# Patient Record
Sex: Female | Born: 1975 | Race: White | Hispanic: No | Smoking: Current every day smoker
Health system: Southern US, Community
[De-identification: ages and names within clinical notes are randomized; demographics above are authoritative.]

## PROBLEM LIST (undated history)

## (undated) DIAGNOSIS — F32A Depression, unspecified: Secondary | ICD-10-CM

## (undated) DIAGNOSIS — I10 Essential (primary) hypertension: Secondary | ICD-10-CM

## (undated) DIAGNOSIS — J45909 Unspecified asthma, uncomplicated: Secondary | ICD-10-CM

## (undated) DIAGNOSIS — K219 Gastro-esophageal reflux disease without esophagitis: Secondary | ICD-10-CM

## (undated) DIAGNOSIS — M5430 Sciatica, unspecified side: Secondary | ICD-10-CM

## (undated) DIAGNOSIS — F329 Major depressive disorder, single episode, unspecified: Secondary | ICD-10-CM

---

## 2018-04-24 ENCOUNTER — Emergency Department: Payer: Self-pay

## 2018-04-24 ENCOUNTER — Encounter: Payer: Self-pay | Admitting: Emergency Medicine

## 2018-04-24 ENCOUNTER — Other Ambulatory Visit: Payer: Self-pay

## 2018-04-24 ENCOUNTER — Emergency Department
Admission: EM | Admit: 2018-04-24 | Discharge: 2018-04-24 | Disposition: A | Discharge status: Home / Self Care | Payer: Self-pay | Attending: Emergency Medicine | Admitting: Emergency Medicine

## 2018-04-24 DIAGNOSIS — E876 Hypokalemia: Secondary | ICD-10-CM | POA: Insufficient documentation

## 2018-04-24 DIAGNOSIS — K3184 Gastroparesis: Secondary | ICD-10-CM | POA: Insufficient documentation

## 2018-04-24 DIAGNOSIS — D473 Essential (hemorrhagic) thrombocythemia: Secondary | ICD-10-CM | POA: Insufficient documentation

## 2018-04-24 DIAGNOSIS — I10 Essential (primary) hypertension: Secondary | ICD-10-CM | POA: Insufficient documentation

## 2018-04-24 DIAGNOSIS — K2900 Acute gastritis without bleeding: Secondary | ICD-10-CM | POA: Insufficient documentation

## 2018-04-24 DIAGNOSIS — F17210 Nicotine dependence, cigarettes, uncomplicated: Secondary | ICD-10-CM | POA: Insufficient documentation

## 2018-04-24 DIAGNOSIS — R112 Nausea with vomiting, unspecified: Secondary | ICD-10-CM | POA: Insufficient documentation

## 2018-04-24 DIAGNOSIS — J45909 Unspecified asthma, uncomplicated: Secondary | ICD-10-CM | POA: Insufficient documentation

## 2018-04-24 DIAGNOSIS — D75839 Thrombocytosis, unspecified: Secondary | ICD-10-CM

## 2018-04-24 HISTORY — DX: Major depressive disorder, single episode, unspecified: F32.9

## 2018-04-24 HISTORY — DX: Unspecified asthma, uncomplicated: J45.909

## 2018-04-24 HISTORY — DX: Sciatica, unspecified side: M54.30

## 2018-04-24 HISTORY — DX: Depression, unspecified: F32.A

## 2018-04-24 HISTORY — DX: Essential (primary) hypertension: I10

## 2018-04-24 HISTORY — DX: Gastro-esophageal reflux disease without esophagitis: K21.9

## 2018-04-24 LAB — POCT PREGNANCY, URINE: Preg Test, Ur: NEGATIVE

## 2018-04-24 LAB — CBC WITH DIFFERENTIAL/PLATELET
BASOS ABS: 0.1 10*3/uL (ref 0–0.1)
BASOS PCT: 1 %
EOS PCT: 1 %
Eosinophils Absolute: 0.1 10*3/uL (ref 0–0.7)
HCT: 53.3 % — ABNORMAL HIGH (ref 35.0–47.0)
Hemoglobin: 18.5 g/dL — ABNORMAL HIGH (ref 12.0–16.0)
Lymphocytes Relative: 16 %
Lymphs Abs: 3.4 10*3/uL (ref 1.0–3.6)
MCH: 30.5 pg (ref 26.0–34.0)
MCHC: 34.8 g/dL (ref 32.0–36.0)
MCV: 87.6 fL (ref 80.0–100.0)
Monocytes Absolute: 1.3 10*3/uL — ABNORMAL HIGH (ref 0.2–0.9)
Monocytes Relative: 6 %
NEUTROS ABS: 15.8 10*3/uL — AB (ref 1.4–6.5)
Neutrophils Relative %: 76 %
Platelets: 669 10*3/uL — ABNORMAL HIGH (ref 150–440)
RBC: 6.09 MIL/uL — AB (ref 3.80–5.20)
RDW: 12.5 % (ref 11.5–14.5)
WBC: 20.7 10*3/uL — AB (ref 3.6–11.0)

## 2018-04-24 LAB — COMPREHENSIVE METABOLIC PANEL
ALT: 22 U/L (ref 0–44)
AST: 23 U/L (ref 15–41)
Albumin: 4.8 g/dL (ref 3.5–5.0)
Alkaline Phosphatase: 71 U/L (ref 38–126)
Anion gap: 22 — ABNORMAL HIGH (ref 5–15)
BUN: 25 mg/dL — ABNORMAL HIGH (ref 6–20)
CO2: 30 mmol/L (ref 22–32)
CREATININE: 0.95 mg/dL (ref 0.44–1.00)
Calcium: 9.9 mg/dL (ref 8.9–10.3)
Chloride: 81 mmol/L — ABNORMAL LOW (ref 98–111)
GFR calc non Af Amer: >60 mL/min (ref >60)
Glucose, Bld: 160 mg/dL — ABNORMAL HIGH (ref 70–99)
Potassium: 2.9 mmol/L — ABNORMAL LOW (ref 3.5–5.1)
Sodium: 133 mmol/L — ABNORMAL LOW (ref 135–145)
Total Bilirubin: 1.3 mg/dL — ABNORMAL HIGH (ref 0.3–1.2)
Total Protein: 8.6 g/dL — ABNORMAL HIGH (ref 6.5–8.1)

## 2018-04-24 LAB — URINALYSIS, COMPLETE (UACMP) WITH MICROSCOPIC
BILIRUBIN URINE: NEGATIVE
Bacteria, UA: NONE SEEN
Glucose, UA: NEGATIVE mg/dL
KETONES UR: 80 mg/dL — AB
Leukocytes, UA: NEGATIVE
Nitrite: NEGATIVE
PH: 7 (ref 5.0–8.0)
Protein, ur: 30 mg/dL — AB
SPECIFIC GRAVITY, URINE: 1.015 (ref 1.005–1.030)

## 2018-04-24 LAB — LIPASE, BLOOD: LIPASE: 31 U/L (ref 11–51)

## 2018-04-24 MED ORDER — FAMOTIDINE IN NACL 20-0.9 MG/50ML-% IV SOLN
20.0000 mg | Freq: Once | INTRAVENOUS | Status: AC
  Administered 2018-04-24: 20 mg via INTRAVENOUS
  Filled 2018-04-24: qty 50

## 2018-04-24 MED ORDER — ONDANSETRON HCL 4 MG/2ML IJ SOLN: 4.0000 mg | Freq: Once | INTRAMUSCULAR | Status: DC

## 2018-04-24 MED ORDER — SODIUM CHLORIDE 0.9 % IV BOLUS
1000.0000 mL | Freq: Once | INTRAVENOUS | Status: AC
  Administered 2018-04-24: 1000 mL via INTRAVENOUS

## 2018-04-24 MED ORDER — LORAZEPAM 2 MG/ML IJ SOLN
1.0000 mg | Freq: Once | INTRAMUSCULAR | Status: AC
  Administered 2018-04-24: 1 mg via INTRAVENOUS
  Filled 2018-04-24: qty 1

## 2018-04-24 MED ORDER — POTASSIUM CHLORIDE CRYS ER 20 MEQ PO TBCR
40.0000 meq | EXTENDED_RELEASE_TABLET | Freq: Once | ORAL | Status: AC
  Administered 2018-04-24: 40 meq via ORAL
  Filled 2018-04-24: qty 2

## 2018-04-24 MED ORDER — METOCLOPRAMIDE HCL 10 MG PO TABS: 10.0000 mg | ORAL_TABLET | Freq: Three times a day (TID) | ORAL | 0 refills | Status: AC | PRN

## 2018-04-24 MED ORDER — METOCLOPRAMIDE HCL 5 MG/ML IJ SOLN
10.0000 mg | Freq: Once | INTRAMUSCULAR | Status: AC
  Administered 2018-04-24: 10 mg via INTRAVENOUS
  Filled 2018-04-24: qty 2

## 2018-04-24 MED ORDER — IOHEXOL 300 MG/ML  SOLN
75.0000 mL | Freq: Once | INTRAMUSCULAR | Status: AC | PRN
  Administered 2018-04-24: 75 mL via INTRAVENOUS

## 2018-04-24 MED ORDER — OMEPRAZOLE 40 MG PO CPDR: 40.0000 mg | DELAYED_RELEASE_CAPSULE | Freq: Every day | ORAL | 1 refills | Status: AC

## 2018-04-24 NOTE — ED Triage Notes (Signed)
Pt here with c/o gastroparesis, and GERD, has been vomiting since Saturday, states she has been unable to keep anything down, is concerned her potassium level has bottomed out, is very anxious, states she just moved here Aug 1st and is out of her Ativan, medicine for depression and some stomach meds.

## 2018-04-24 NOTE — Discharge Instructions (Signed)
Take omeprazole daily as prescribed.  Take Reglan as needed for nausea and vomiting.  You need to follow-up with your primary care doctor for further evaluation.  Your labs showed low potassium for which you were given through the IV and orally a potassium supplementation.  Your labs also showed elevated platelets.  Make sure to discuss the finding with your primary care doctor.  You will need repeat labs in a week to 10 days to ensure that her platelets have normalized.

## 2018-04-24 NOTE — ED Provider Notes (Signed)
[facility] Emergency Department Provider Note  ____________________________________________  Time seen: Approximately 8:17 AM  I have reviewed the triage vital signs and the nursing notes.   HISTORY  Chief Complaint Abdominal Pain; Emesis; and Anxiety   HPI Abigail Schroeder is a 42 y.o. female with a history of GERD, gastroparesis, depression, panic attacks who presents for evaluation of abdominal pain, nausea and vomiting.  Patient reports that her symptoms started 4 days ago.  She has been unable to keep anything down.  Every time she eats or drinks something she vomits.  She reports that her anxiety has been very severe since her symptoms started specially because she recently moved here, has no health insurance, no primary care doctor, and does not have any of her medications.  She reports that she usually takes Ativan for her anxiety.  She is also complaining of mild sharp intermittent epigastric abdominal pain.  No pain at this time.  No dysuria or hematuria, no fever chills, no chest pain or shortness of breath, no diarrhea or constipation.  Patient has had a C-section but no other abdominal surgeries.  Patient reports that her symptoms are identical to prior gastroparesis flares.  Past Medical History:  Diagnosis Date  . Asthma   . Depression   . GERD (gastroesophageal reflux disease)   . Hypertension   . Sciatic leg pain     Past Surgical History:  Procedure Laterality Date  . CESAREAN SECTION      Prior to Admission medications   Medication Sig Start Date End Date Taking? Authorizing Provider  metoCLOPramide (REGLAN) 10 MG tablet Take 1 tablet (10 mg total) by mouth every 8 (eight) hours as needed for up to 3 days for nausea. 04/24/18 04/27/18  Rudene Re, MD  omeprazole (PRILOSEC) 40 MG capsule Take 1 capsule (40 mg total) by mouth daily. 04/24/18 04/24/19  Rudene Re, MD    Allergies Asa [aspirin]  No family history on  file.  Social History Social History   Tobacco Use  . Smoking status: Current Every Day Smoker    Packs/day: 0.50    Types: Cigarettes  . Smokeless tobacco: Never Used  Substance Use Topics  . Alcohol use: Not Currently  . Drug use: Yes    Types: Marijuana    Review of Systems  Constitutional: Negative for fever. Eyes: Negative for visual changes. ENT: Negative for sore throat. Neck: No neck pain  Cardiovascular: Negative for chest pain. Respiratory: Negative for shortness of breath. Gastrointestinal: + abdominal pain, nausea, and vomiting. No diarrhea. Genitourinary: Negative for dysuria. Musculoskeletal: Negative for back pain. Skin: Negative for rash. Neurological: Negative for headaches, weakness or numbness. Psych: No SI or HI. + anxiety  ____________________________________________   PHYSICAL EXAM:  VITAL SIGNS: ED Triage Vitals  Enc Vitals Group     BP 04/24/18 0756 (!) 142/116     Pulse Rate 04/24/18 0756 (!) 130     Resp 04/24/18 0756 18     Temp 04/24/18 0756 97.6 F (36.4 C)     Temp Source 04/24/18 0756 Oral     SpO2 04/24/18 0756 98 %     Weight 04/24/18 0757 105 lb (47.6 kg)     Height 04/24/18 0757 4\' 11"  (1.499 m)     Head Circumference --      Peak Flow --      Pain Score 04/24/18 0757 10     Pain Loc --      Pain Edu? --  Excl. in [place]? --     Constitutional: Alert and oriented, anxious appearing, no distress.  HEENT:      Head: Normocephalic and atraumatic.         Eyes: Conjunctivae are normal. Sclera is non-icteric.       Mouth/Throat: Mucous membranes are moist.       Neck: Supple with no signs of meningismus. Cardiovascular: Tachycardic with regular regular rate and rhythm. No murmurs, gallops, or rubs. 2+ symmetrical distal pulses are present in all extremities. No JVD. Respiratory: Normal respiratory effort. Lungs are clear to auscultation bilaterally. No wheezes, crackles, or rhonchi.  Gastrointestinal: Soft, non tender, and  non distended with positive bowel sounds. No rebound or guarding. Genitourinary: No CVA tenderness. Musculoskeletal: Nontender with normal range of motion in all extremities. No edema, cyanosis, or erythema of extremities. Neurologic: Normal speech and language. Face is symmetric. Moving all extremities. No gross focal neurologic deficits are appreciated. Skin: Skin is warm, dry and intact. No rash noted. Psychiatric: Mood and affect are normal. Speech and behavior are normal.  ____________________________________________   LABS (all labs ordered are listed, but only abnormal results are displayed)  Labs Reviewed  CBC WITH DIFFERENTIAL/PLATELET - Abnormal; Notable for the following components:      Result Value   WBC 20.7 (*)    RBC 6.09 (*)    Hemoglobin 18.5 (*)    HCT 53.3 (*)    Platelets 669 (*)    Neutro Abs 15.8 (*)    Monocytes Absolute 1.3 (*)    All other components within normal limits  COMPREHENSIVE METABOLIC PANEL - Abnormal; Notable for the following components:   Sodium 133 (*)    Potassium 2.9 (*)    Chloride 81 (*)    Glucose, Bld 160 (*)    BUN 25 (*)    Total Protein 8.6 (*)    Total Bilirubin 1.3 (*)    Anion gap 22 (*)    All other components within normal limits  URINALYSIS, COMPLETE (UACMP) WITH MICROSCOPIC - Abnormal; Notable for the following components:   Color, Urine YELLOW (*)    APPearance CLEAR (*)    Hgb urine dipstick SMALL (*)    Ketones, ur 80 (*)    Protein, ur 30 (*)    All other components within normal limits  LIPASE, BLOOD  POCT PREGNANCY, URINE   ____________________________________________  EKG  ED ECG REPORT I, Rudene Re, the attending physician, personally viewed and interpreted this ECG.  Normal sinus rhythm, rate of 97, normal intervals, right axis deviation, no ST elevations or depressions.  No prior for comparison. ____________________________________________  RADIOLOGY  I have personally reviewed the images  performed during this visit and I agree with the Radiologist's read.   Interpretation by Radiologist:  Ct Abdomen Pelvis W Contrast  Result Date: 04/24/2018 CLINICAL DATA:  Epigastric abdominal pain and vomiting. Leukocytosis. EXAM: CT ABDOMEN AND PELVIS WITH CONTRAST TECHNIQUE: Multidetector CT imaging of the abdomen and pelvis was performed using the standard protocol following bolus administration of intravenous contrast. CONTRAST:  21mL OMNIPAQUE IOHEXOL 300 MG/ML  SOLN COMPARISON:  None. FINDINGS: Lower chest: No acute abnormality. Hepatobiliary: No focal liver abnormality is seen. No gallstones, gallbladder wall thickening, or biliary dilatation. Pancreas: Unremarkable. No pancreatic ductal dilatation or surrounding inflammatory changes. Spleen: Normal in size without focal abnormality. Adrenals/Urinary Tract: Adrenal glands are unremarkable. Mild left renal cortical scarring. No renal calculi, focal lesion, or hydronephrosis. Bladder is unremarkable. Stomach/Bowel: Mild proximal gastric wall thickening. The  small bowel and colon are unremarkable. No bowel obstruction. Normal appendix. Vascular/Lymphatic: Minimal aortic atherosclerosis. No enlarged abdominal or pelvic lymph nodes. Reproductive: Uterus and bilateral adnexa are unremarkable. Dominant follicles in both ovaries. Other: No abdominal wall hernia or abnormality. No abdominopelvic ascites. No pneumoperitoneum. Musculoskeletal: No acute or significant osseous findings. IMPRESSION: 1. Mild proximal gastric wall thickening may reflect gastritis. 2. Otherwise, no acute intra-abdominal process. Electronically Signed   By: Titus Dubin M.D.   On: 04/24/2018 11:30   Dg Abd 2 Views  Result Date: 04/24/2018 CLINICAL DATA:  Nausea vomiting since Saturday. EXAM: ABDOMEN - 2 VIEW COMPARISON:  CT abdomen pelvis from same day. FINDINGS: The bowel gas pattern is normal. There is no evidence of free air. No radio-opaque calculi or other significant  radiographic abnormality is seen. Excreted contrast within the renal collecting systems, ureters, and bladder. Duplicated collecting system on the left. No acute osseous abnormality. IMPRESSION: 1. No acute abnormality.  No obstruction. 2. Duplicated left renal collecting system. Electronically Signed   By: Titus Dubin M.D.   On: 04/24/2018 11:33     ____________________________________________   PROCEDURES  Procedure(s) performed: None Procedures Critical Care performed:  None ____________________________________________   INITIAL IMPRESSION / ASSESSMENT AND PLAN / ED COURSE  42 y.o. female with a history of GERD, gastroparesis, depression, panic attacks who presents for evaluation of abdominal pain, nausea and vomiting.  Patient is anxious appearing but no obvious distress, she is tachycardic and hypertensive which goes with anxiety attack.  Will give a dose of IV Ativan.  Will check labs to rule out AKI, electrolyte abnormalities.  Will give IV fluids, IV Reglan, IV Pepcid, and IV Ativan.  Patient's abdomen is soft with no tenderness with no clinical signs of appendicitis or GB disease.   Labs showed elevated AG, patient given 2L NS. Hypokalemia, supplemented PO and IV. WBC 20K and thrombocytosis.   Clinical Course as of Apr 24 1140  Wed Apr 24, 2018  1136 Patient reports feeling markedly improved.  She is tolerating p.o.  CT abdomen pelvis was done due to leukocytosis of 20,000 and shows gastritis but no other acute findings.  Will provide patient with prescription for omeprazole and Reglan which she takes regularly for her gastroparesis and GERD.  Discussed follow-up with primary care doctor and return precautions for new or worsening abdominal pain, chest pain or fever.   [CV]    Clinical Course User Index [CV] Alfred Levins [place], MD     As part of my medical decision making, I reviewed the following data within the [facility] notes reviewed and  incorporated, Labs reviewed , EKG interpreted , Radiograph reviewed , Notes from prior ED visits and [place] Controlled Substance Database    Pertinent labs & imaging results that were available during my care of the patient were reviewed by me and considered in my medical decision making (see chart for details).    ____________________________________________   FINAL CLINICAL IMPRESSION(S) / ED DIAGNOSES  Final diagnoses:  Nausea and vomiting  Acute gastritis without hemorrhage, unspecified gastritis type  Hypokalemia  Thrombocytosis (HCC)      NEW MEDICATIONS STARTED DURING THIS VISIT:  ED Discharge Orders         Ordered    omeprazole (PRILOSEC) 40 MG capsule  Daily     04/24/18 1137    metoCLOPramide (REGLAN) 10 MG tablet  Every 8 hours PRN     04/24/18 1137  Note:  This document was prepared using Dragon voice recognition software and may include unintentional dictation errors.    Alfred Levins, [place], MD 04/24/18 [phone]

## 2018-04-24 NOTE — ED Notes (Signed)
ED Provider at bedside. 

## 2018-05-09 ENCOUNTER — Telehealth: Payer: Self-pay | Admitting: Pharmacy Technician

## 2018-05-09 NOTE — Telephone Encounter (Signed)
Received updated proof of income.  Patient eligible to receive medication assistance at [facility] as long as eligibility requirements continue to be met.  Betty J. Kluttz Care Manager [facility] 

## 2018-05-22 ENCOUNTER — Ambulatory Visit: Payer: Self-pay

## 2018-05-29 ENCOUNTER — Telehealth: Payer: Self-pay | Admitting: Internal Medicine

## 2018-05-29 NOTE — Telephone Encounter (Signed)
Was told by mother that Abigail Schroeder had returned to [place] and may not be back to [place]. I advised her to call us if interested in becoming patient.

## 2020-01-14 IMAGING — CT CT ABD-PELV W/ CM
2 of 4 series · 17 of 46 positions shown, 19 images · IV contrast (omnipaque)
Comparison: None.

CLINICAL DATA: Epigastric abdominal pain and vomiting.
Leukocytosis.

EXAM:
CT ABDOMEN AND PELVIS WITH CONTRAST
TECHNIQUE: Multidetector CT imaging of the abdomen and pelvis was performed
using the standard protocol following bolus administration of
intravenous contrast.
CONTRAST:  75mL OMNIPAQUE IOHEXOL 300 MG/ML  SOLN

[Series 2: axial st · axial · 0.64mm/px · z∈[-471,-111]mm · 14 of 80 slices shown, 16 images]
[im 4/80  soft-tissue]
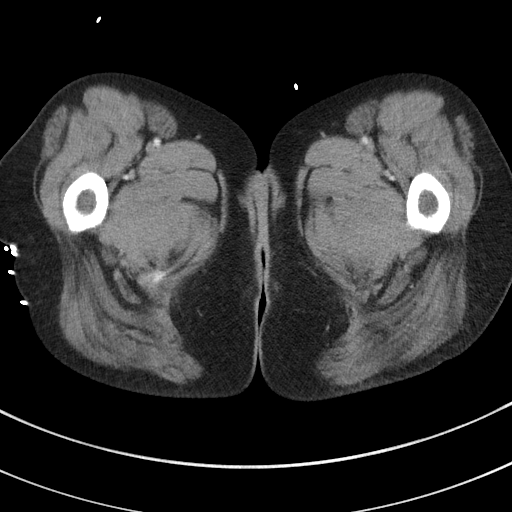
[im 4/80  bone]
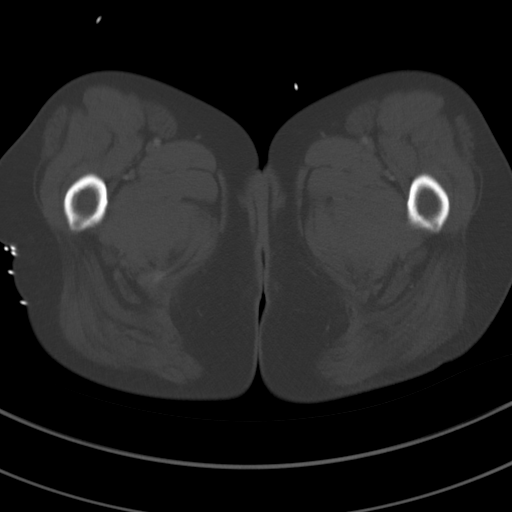
[im 10/80  soft-tissue]
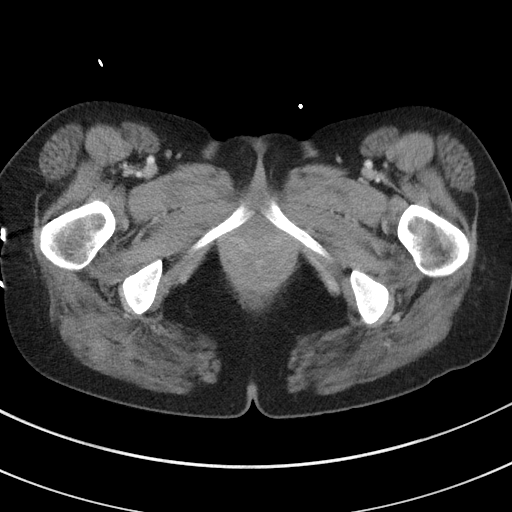
[im 16/80  soft-tissue]
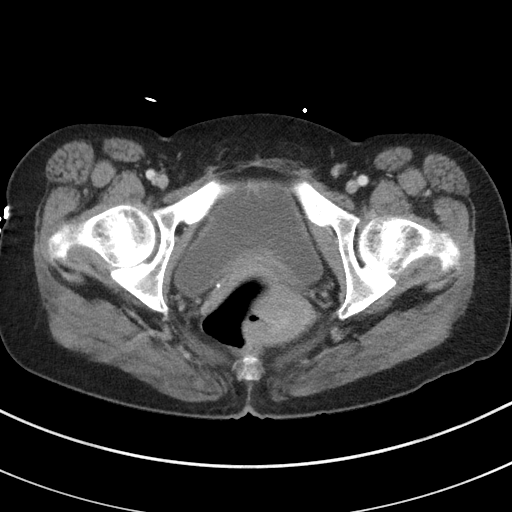
[im 23/80  soft-tissue]
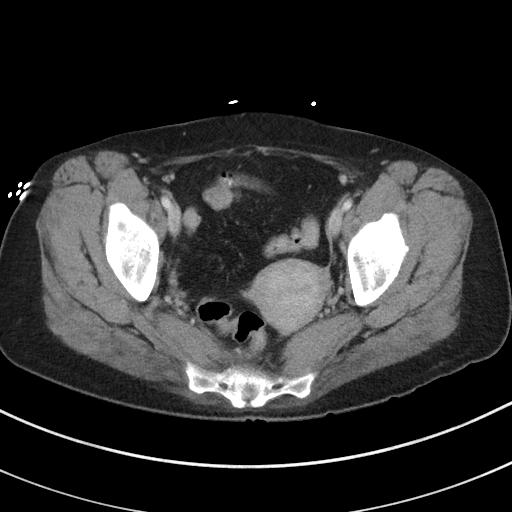
[im 26/80  soft-tissue]
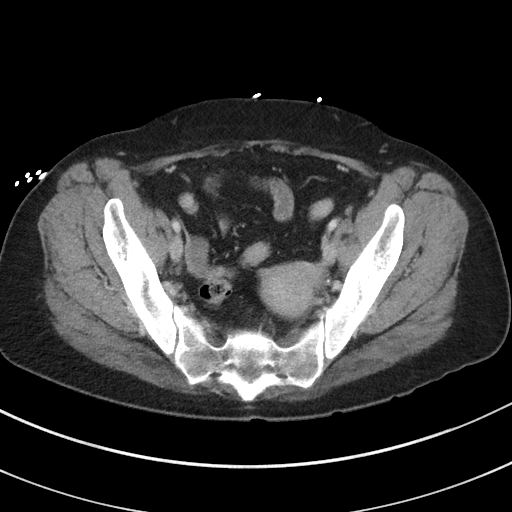
[im 32/80  soft-tissue]
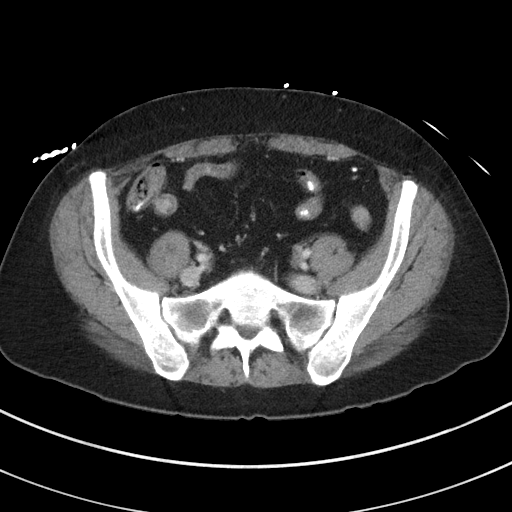
[im 38/80  soft-tissue]
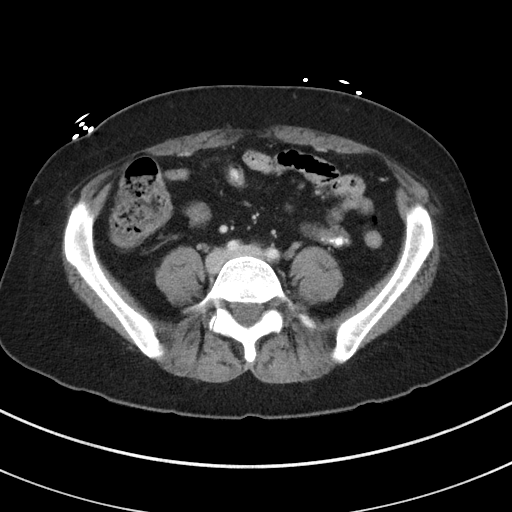
[im 42/80  soft-tissue]
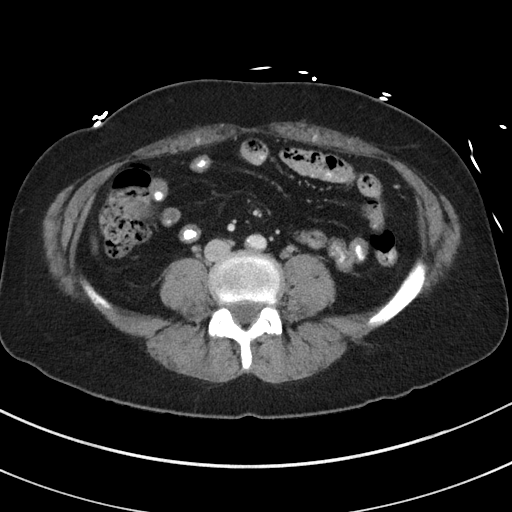
[im 48/80  soft-tissue]
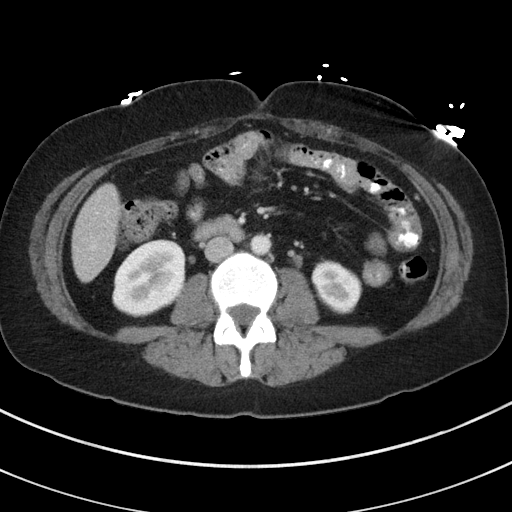
[im 48/80  bone]
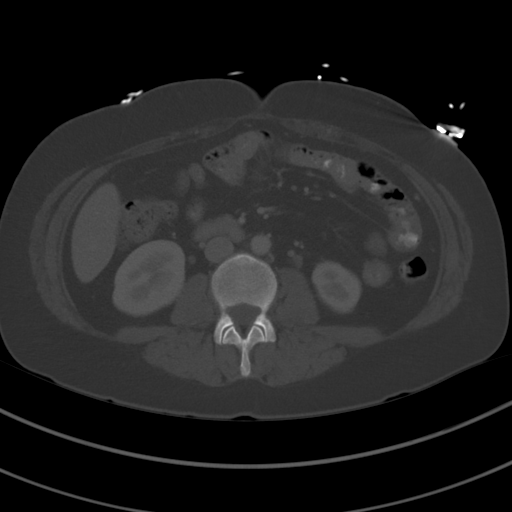
[im 54/80  soft-tissue]
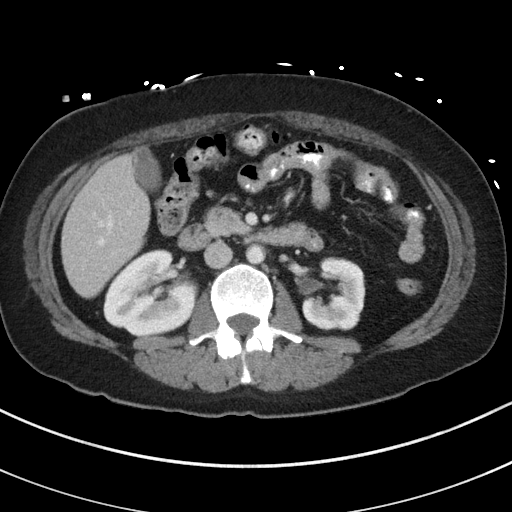
[im 61/80  soft-tissue]
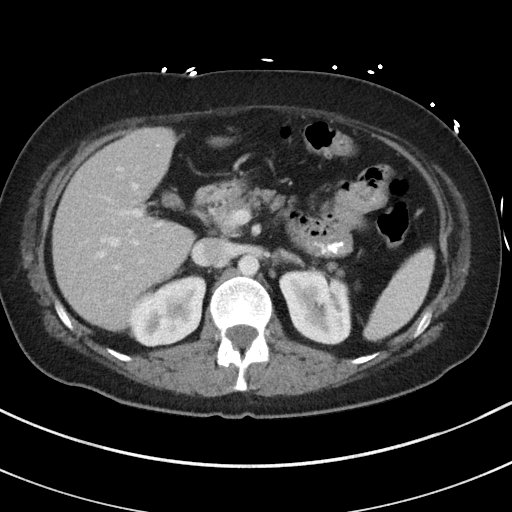
[im 64/80  soft-tissue]
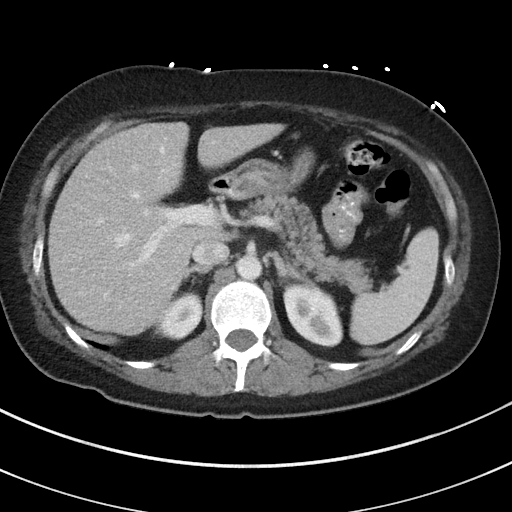
[im 70/80  soft-tissue]
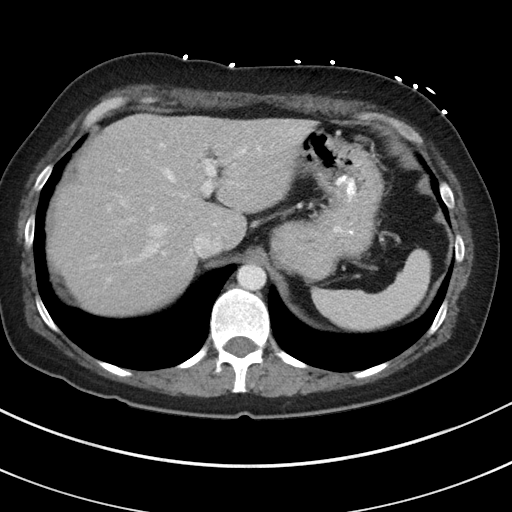
[im 76/80  soft-tissue]
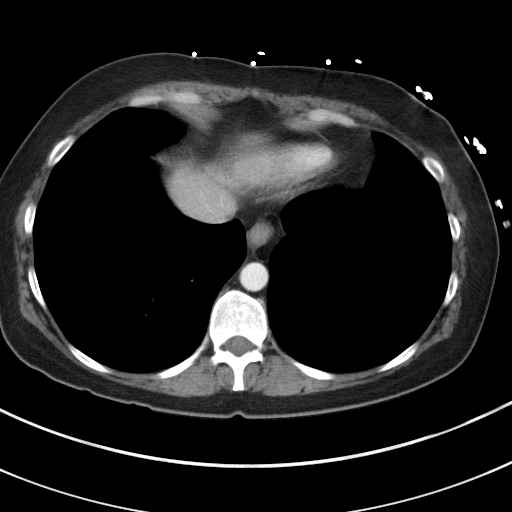

[Series 5: coronal st · coronal · 0.72mm/px · 3 of 80 slices shown]
[im 27/80  soft-tissue]
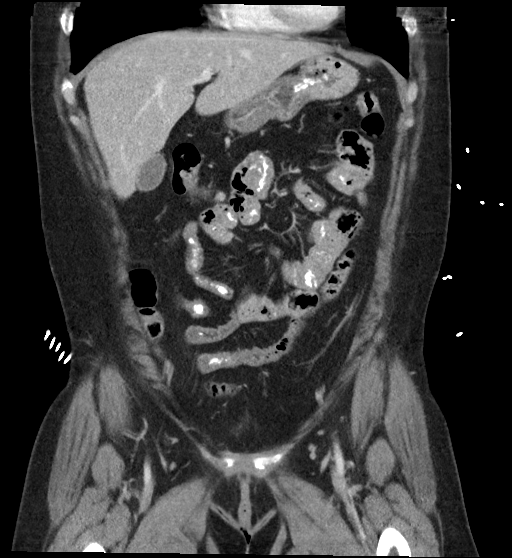
[im 36/80  soft-tissue]
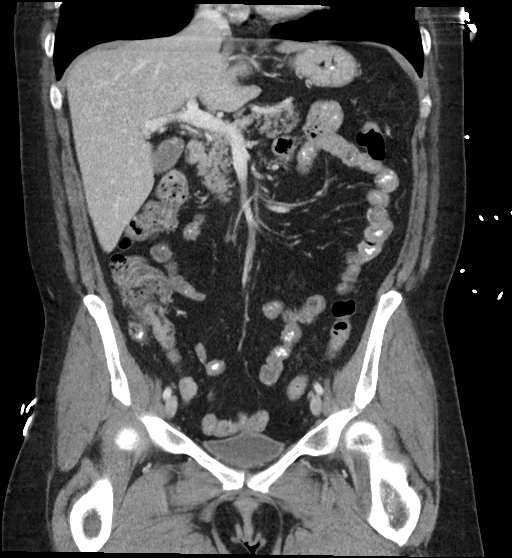
[im 44/80  soft-tissue]
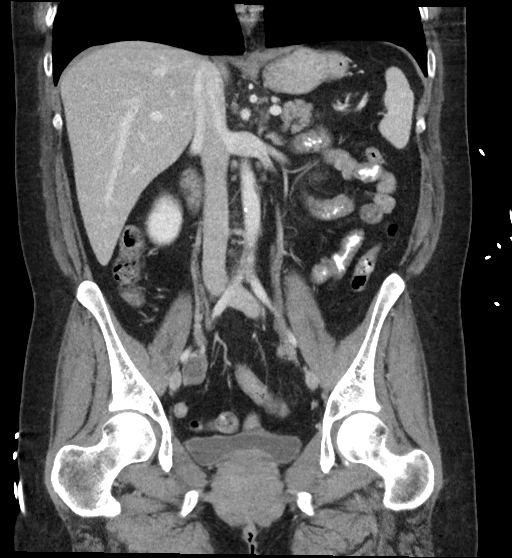

[17 of 46 positions shown; findings below may reference images not displayed]

FINDINGS: Lower chest: No acute abnormality.

Hepatobiliary: No focal liver abnormality is seen. No gallstones,
gallbladder wall thickening, or biliary dilatation.

Pancreas: Unremarkable. No pancreatic ductal dilatation or
surrounding inflammatory changes.

Spleen: Normal in size without focal abnormality.

Adrenals/Urinary Tract: Adrenal glands are unremarkable. Mild left
renal cortical scarring. No renal calculi, focal lesion, or
hydronephrosis. Bladder is unremarkable.

Stomach/Bowel: Mild proximal gastric wall thickening. The small
bowel and colon are unremarkable. No bowel obstruction. Normal
appendix.

Vascular/Lymphatic: Minimal aortic atherosclerosis. No enlarged
abdominal or pelvic lymph nodes.

Reproductive: Uterus and bilateral adnexa are unremarkable. Dominant
follicles in both ovaries.

Other: No abdominal wall hernia or abnormality. No abdominopelvic
ascites. No pneumoperitoneum.

Musculoskeletal: No acute or significant osseous findings.
IMPRESSION: 1. Mild proximal gastric wall thickening may reflect gastritis.
2. Otherwise, no acute intra-abdominal process.

## 2023-01-08 ENCOUNTER — Other Ambulatory Visit: Payer: Self-pay
